# Patient Record
Sex: Male | Born: 1985 | Hispanic: Yes | Marital: Single | Smoking: Never smoker
Health system: Southern US, Community
[De-identification: ages and names within clinical notes are randomized; demographics above are authoritative.]

---

## 2015-10-04 ENCOUNTER — Emergency Department (HOSPITAL_COMMUNITY): Payer: Self-pay

## 2015-10-04 ENCOUNTER — Observation Stay (HOSPITAL_COMMUNITY)
Admission: EM | Admit: 2015-10-04 | Discharge: 2015-10-05 | Disposition: A | Discharge status: Home / Self Care | Payer: Self-pay | Attending: General Surgery | Admitting: General Surgery

## 2015-10-04 ENCOUNTER — Encounter (HOSPITAL_COMMUNITY): Payer: Self-pay | Admitting: General Surgery

## 2015-10-04 DIAGNOSIS — R739 Hyperglycemia, unspecified: Secondary | ICD-10-CM | POA: Insufficient documentation

## 2015-10-04 DIAGNOSIS — S62601A Fracture of unspecified phalanx of left index finger, initial encounter for closed fracture: Principal | ICD-10-CM | POA: Insufficient documentation

## 2015-10-04 DIAGNOSIS — S060X9A Concussion with loss of consciousness of unspecified duration, initial encounter: Secondary | ICD-10-CM | POA: Diagnosis present

## 2015-10-04 DIAGNOSIS — S0081XA Abrasion of other part of head, initial encounter: Secondary | ICD-10-CM | POA: Insufficient documentation

## 2015-10-04 DIAGNOSIS — R51 Headache: Secondary | ICD-10-CM | POA: Insufficient documentation

## 2015-10-04 DIAGNOSIS — F10129 Alcohol abuse with intoxication, unspecified: Secondary | ICD-10-CM | POA: Insufficient documentation

## 2015-10-04 DIAGNOSIS — S51012A Laceration without foreign body of left elbow, initial encounter: Secondary | ICD-10-CM | POA: Insufficient documentation

## 2015-10-04 LAB — ETHANOL: ALCOHOL ETHYL (B): 250 mg/dL — AB (ref <5)

## 2015-10-04 LAB — ABO/RH: ABO/RH(D): O POS

## 2015-10-04 LAB — COMPREHENSIVE METABOLIC PANEL
ALK PHOS: 80 U/L (ref 38–126)
ALT: 25 U/L (ref 17–63)
ANION GAP: 11 (ref 5–15)
AST: 28 U/L (ref 15–41)
Albumin: 4.4 g/dL (ref 3.5–5.0)
BILIRUBIN TOTAL: 0.5 mg/dL (ref 0.3–1.2)
BUN: 7 mg/dL (ref 6–20)
CALCIUM: 9.1 mg/dL (ref 8.9–10.3)
CO2: 24 mmol/L (ref 22–32)
Chloride: 108 mmol/L (ref 101–111)
Creatinine, Ser: 0.77 mg/dL (ref 0.61–1.24)
Glucose, Bld: 139 mg/dL — ABNORMAL HIGH (ref 65–99)
POTASSIUM: 3.7 mmol/L (ref 3.5–5.1)
Sodium: 143 mmol/L (ref 135–145)
TOTAL PROTEIN: 7.3 g/dL (ref 6.5–8.1)

## 2015-10-04 LAB — PREPARE FRESH FROZEN PLASMA
UNIT DIVISION: 0
Unit division: 0

## 2015-10-04 LAB — PROTIME-INR
INR: 1.02 (ref 0.00–1.49)
PROTHROMBIN TIME: 13.6 s (ref 11.6–15.2)

## 2015-10-04 LAB — CBC
HCT: 47.9 % (ref 39.0–52.0)
HEMATOCRIT: 49.3 % (ref 39.0–52.0)
HEMOGLOBIN: 16.9 g/dL (ref 13.0–17.0)
Hemoglobin: 16.1 g/dL (ref 13.0–17.0)
MCH: 30.7 pg (ref 26.0–34.0)
MCH: 30.1 pg (ref 26.0–34.0)
MCHC: 34.3 g/dL (ref 30.0–36.0)
MCHC: 33.6 g/dL (ref 30.0–36.0)
MCV: 89.6 fL (ref 78.0–100.0)
MCV: 89.7 fL (ref 78.0–100.0)
Platelets: 285 10*3/uL (ref 150–400)
Platelets: 268 10*3/uL (ref 150–400)
RBC: 5.5 MIL/uL (ref 4.22–5.81)
RBC: 5.34 MIL/uL (ref 4.22–5.81)
RDW: 13 % (ref 11.5–15.5)
RDW: 13.3 % (ref 11.5–15.5)
WBC: 11.8 10*3/uL — AB (ref 4.0–10.5)
WBC: 16.2 10*3/uL — ABNORMAL HIGH (ref 4.0–10.5)

## 2015-10-04 LAB — CREATININE, SERUM
Creatinine, Ser: 0.75 mg/dL (ref 0.61–1.24)
GFR calc Af Amer: >60 mL/min (ref >60)

## 2015-10-04 LAB — CDS SEROLOGY

## 2015-10-04 MED ORDER — MORPHINE SULFATE (PF) 4 MG/ML IV SOLN
4.0000 mg | Freq: Once | INTRAVENOUS | Status: AC
  Administered 2015-10-04: 4 mg via INTRAVENOUS
  Filled 2015-10-04: qty 1

## 2015-10-04 MED ORDER — BACITRACIN ZINC 500 UNIT/GM EX OINT
TOPICAL_OINTMENT | Freq: Two times a day (BID) | CUTANEOUS | Status: DC
  Administered 2015-10-04: 12:00:00 via TOPICAL
  Administered 2015-10-04: 15.5556 via TOPICAL
  Administered 2015-10-05: 10:00:00 via TOPICAL
  Filled 2015-10-04: qty 0.9
  Filled 2015-10-04: qty 28.35

## 2015-10-04 MED ORDER — POTASSIUM CHLORIDE IN NACL 20-0.9 MEQ/L-% IV SOLN
INTRAVENOUS | Status: DC
  Administered 2015-10-04 – 2015-10-05 (×2): via INTRAVENOUS
  Filled 2015-10-04 (×2): qty 1000

## 2015-10-04 MED ORDER — ONDANSETRON HCL 4 MG/2ML IJ SOLN: 4.0000 mg | Freq: Four times a day (QID) | INTRAMUSCULAR | Status: DC | PRN

## 2015-10-04 MED ORDER — IOHEXOL 300 MG/ML  SOLN
100.0000 mL | Freq: Once | INTRAMUSCULAR | Status: AC | PRN
  Administered 2015-10-04: 100 mL via INTRAVENOUS

## 2015-10-04 MED ORDER — ENOXAPARIN SODIUM 40 MG/0.4ML ~~LOC~~ SOLN
40.0000 mg | SUBCUTANEOUS | Status: DC
  Administered 2015-10-05: 40 mg via SUBCUTANEOUS
  Filled 2015-10-04: qty 0.4

## 2015-10-04 MED ORDER — HYDROCODONE-ACETAMINOPHEN 5-325 MG PO TABS
2.0000 | ORAL_TABLET | ORAL | Status: DC | PRN
  Administered 2015-10-04: 2 via ORAL
  Filled 2015-10-04: qty 2

## 2015-10-04 MED ORDER — ONDANSETRON HCL 4 MG PO TABS: 4.0000 mg | ORAL_TABLET | Freq: Four times a day (QID) | ORAL | Status: DC | PRN

## 2015-10-04 MED ORDER — SODIUM CHLORIDE 0.9 % IV SOLN
Freq: Once | INTRAVENOUS | Status: AC
  Administered 2015-10-04: 10:00:00 via INTRAVENOUS

## 2015-10-04 MED ORDER — MORPHINE SULFATE (PF) 2 MG/ML IV SOLN: 2.0000 mg | INTRAVENOUS | Status: DC | PRN

## 2015-10-04 MED ORDER — TETANUS-DIPHTH-ACELL PERTUSSIS 5-2.5-18.5 LF-MCG/0.5 IM SUSP
0.5000 mL | Freq: Once | INTRAMUSCULAR | Status: AC
  Administered 2015-10-04: 0.5 mL via INTRAMUSCULAR
  Filled 2015-10-04: qty 0.5
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
## 2015-10-04 NOTE — ED Notes (Signed)
Patient C-collar removed per MD Tsuei.  Patients abrasions on the bilateral arms and face cleaned.

## 2015-10-04 NOTE — ED Notes (Signed)
Applied dressing to the left index finger.
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
## 2015-10-04 NOTE — ED Notes (Signed)
Patient transported to CT.  RN at bedside with patient during transport.

## 2015-10-04 NOTE — ED Notes (Signed)
Paged Dr. Mina MarbleWeingold to Dr. Corliss Skainssuei

## 2015-10-04 NOTE — ED Notes (Signed)
Paged Dr. Weingold to Dr. Tsuei 

## 2015-10-05 LAB — TYPE AND SCREEN
ABO/RH(D): O POS
Antibody Screen: NEGATIVE
Unit division: 0
Unit division: 0

## 2015-10-05 LAB — BLOOD PRODUCT ORDER (VERBAL) VERIFICATION

## 2015-10-05 MED ORDER — HYDROCODONE-ACETAMINOPHEN 5-325 MG PO TABS: 1.0000 | ORAL_TABLET | ORAL | Status: DC | PRN
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
## 2015-10-05 NOTE — Discharge Instructions (Signed)
Fracturas de los dedos °(Finger Fracture) °Las fracturas de los dedos son rupturas de los huesos de los dedos. Hay diferentes tipos de fractura. Hay distintas formas de tratamiento para estas fracturas. Su médico hablará con usted sobre la mejor manera de tratar la fractura. °CAUSAS °Una lesión traumática es la causa principal de las fracturas de los dedos. Estas pueden ser: °· Lesiones sufridas mientras se practica un deporte. °· Lesiones en el lugar de trabajo. °· Caídas. °FACTORES DE RIESGO °Estas son algunas actividades que pueden aumentar su riesgo de sufrir fracturas de los dedos: °· Deportes. °· Actividades en el lugar de trabajo que incluyen el uso de maquinaria. °· Una afección denominada osteoporosis, que puede hacer que sus huesos sean menos densos y se fracturen con más facilidad. °SIGNOS Y SÍNTOMAS °Los síntomas principales de una fractura de dedo son dolor e hinchazón dentro de los 15 minutos posteriores a la lesión. Otros síntomas son: °· Hematoma en el dedo. °· Entumecimiento en el dedo. °· Adormecimiento del dedo. °· Huesos expuestos (fractura compuesta) si la fractura es grave. °DIAGNÓSTICO  °La mejor manera de diagnosticar una fractura de hueso es con radiografías. Además, su médico usará esta radiografía para evaluar la posición de los huesos de los dedos fracturados.  °TRATAMIENTO  °Las fracturas de los dedos pueden tratarse con los siguientes métodos:  °· No reducción: significa que los huesos están en su lugar. El dedo se entablilla sin cambiar la posición de las piezas de hueso. Generalmente la tablilla se deja entre una semana y diez días. Esto dependerá de la fractura y de lo que el médico considere adecuado. °· Reducción cerrada: los huesos se colocan nuevamente en su posición, sin necesidad de cirugía. Luego el dedo se entablilla. °· Reducción abierta y fijación interna: el lugar de la fractura está abierto. Luego las piezas de hueso se fijan en el lugar con clavos o con algún tipo de  material duro. Con frecuencia esto es necesario. Depende de la gravedad de la fractura. °INSTRUCCIONES PARA EL CUIDADO EN EL HOGAR  °· Siga las indicaciones del médico en cuanto a la realización de actividades, ejercicios y fisioterapia. °· Utilice los medicamentos de venta libre o recetados para calmar el dolor, el malestar o la fiebre, según se lo indique el médico. °SOLICITE ATENCIÓN MÉDICA SI: °Tiene dolor o hinchazón que limita el movimiento o el uso de los dedos. °SOLICITE ATENCIÓN MÉDICA DE INMEDIATO SI:  °Tiene adormecimiento en el dedo. °ASEGÚRESE DE QUE:  °· Comprende estas instrucciones. °· Controlará su afección. °· Recibirá ayuda de inmediato si no mejora o si empeora. °  °Esta información no tiene como fin reemplazar el consejo del médico. Asegúrese de hacerle al médico cualquier pregunta que tenga. °  °Document Released: 07/06/2005 Document Revised: 07/17/2013 °Elsevier Interactive Patient Education ©2016 [employer] Inc. ° °

## 2015-12-17 ENCOUNTER — Ambulatory Visit: Payer: Self-pay | Admitting: Physician Assistant

## 2015-12-23 ENCOUNTER — Encounter: Payer: Self-pay | Admitting: Physician Assistant

## 2016-01-06 ENCOUNTER — Ambulatory Visit: Payer: Self-pay | Admitting: Physician Assistant

## 2016-01-06 ENCOUNTER — Encounter: Payer: Self-pay | Admitting: Physician Assistant

## 2016-01-06 VITALS — BP 102/70 | HR 64 | Temp 98.1°F | Ht 67.0 in | Wt 174.0 lb

## 2016-01-06 DIAGNOSIS — Z0000 Encounter for general adult medical examination without abnormal findings: Secondary | ICD-10-CM

## 2016-01-06 NOTE — Progress Notes (Signed)
   BP 102/70 mmHg  Pulse 64  Temp(Src) 98.1 F (36.7 C)  Ht 5\' 7"  (1.702 m)  Wt 174 lb (78.926 kg)  BMI 27.25 kg/m2  SpO2 99%   Subjective:    Patient ID: Brett Howell, male    DOB: 1986-04-21, 30 y.o.   MRN: [id number]  HPI: Brett Howell is a 30 y.o. male presenting on 01/06/2016 for Follow-up   HPI   Chief Complaint  Patient presents with  . Follow-up    1 year f/u. pt states he feels fine and has no problems     Relevant past medical, surgical, family and social history reviewed and updated as indicated. Interim medical history since our last visit reviewed. Allergies and medications reviewed and updated.  No current outpatient prescriptions on file.   Review of Systems  Constitutional: Negative for fever, chills, diaphoresis, appetite change, fatigue and unexpected weight change.  HENT: Negative for congestion, dental problem, drooling, ear pain, facial swelling, hearing loss, mouth sores, sneezing, sore throat, trouble swallowing and voice change.   Eyes: Negative for pain, discharge, redness, itching and visual disturbance.  Respiratory: Negative for cough, choking, shortness of breath and wheezing.   Cardiovascular: Negative for chest pain, palpitations and leg swelling.  Gastrointestinal: Negative for vomiting, abdominal pain, diarrhea, constipation and blood in stool.  Endocrine: Negative for cold intolerance, heat intolerance and polydipsia.  Genitourinary: Negative for dysuria, hematuria and decreased urine volume.  Musculoskeletal: Negative for back pain, arthralgias and gait problem.  Skin: Negative for rash.  Allergic/Immunologic: Negative for environmental allergies.  Neurological: Negative for seizures, syncope, light-headedness and headaches.  Hematological: Negative for adenopathy.  Psychiatric/Behavioral: Negative for suicidal ideas, dysphoric mood and agitation. The patient is not nervous/anxious.     Per HPI unless specifically indicated above     Objective:    BP 102/70 mmHg  Pulse 64  Temp(Src) 98.1 F (36.7 C)  Ht 5\' 7"  (1.702 m)  Wt 174 lb (78.926 kg)  BMI 27.25 kg/m2  SpO2 99%  Wt Readings from Last 3 Encounters:  01/06/16 174 lb (78.926 kg)  10/04/15 160 lb (72.576 kg)    Physical Exam  Constitutional: He is oriented to person, place, and time. He appears well-developed and well-nourished.  HENT:  Head: Normocephalic and atraumatic.  Neck: Neck supple.  Cardiovascular: Normal rate and regular rhythm.   Pulmonary/Chest: Effort normal and breath sounds normal. He has no wheezes.  Abdominal: Soft. Bowel sounds are normal. There is no hepatosplenomegaly. There is no tenderness.  Musculoskeletal: He exhibits no edema.  Lymphadenopathy:    He has no cervical adenopathy.  Neurological: He is alert and oriented to person, place, and time.  Skin: Skin is warm and dry.  Psychiatric: He has a normal mood and affect. His behavior is normal.  Vitals reviewed.       Assessment & Plan:     Encounter Diagnosis  Name Primary?  . Well adult exam Yes   -f/u 1 year.  RTO sooner prn

## 2016-01-08 DIAGNOSIS — Z0000 Encounter for general adult medical examination without abnormal findings: Secondary | ICD-10-CM | POA: Insufficient documentation
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
## 2017-01-05 ENCOUNTER — Ambulatory Visit: Payer: Self-pay | Admitting: Physician Assistant

## 2017-01-16 ENCOUNTER — Encounter: Payer: Self-pay | Admitting: Physician Assistant
